# Patient Record
Sex: Male | Born: 2000
Health system: Southern US, Community
[De-identification: ages and names within clinical notes are randomized; demographics above are authoritative.]

---
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
## 2019-04-08 DIAGNOSIS — S50812A Abrasion of left forearm, initial encounter: Secondary | ICD-10-CM | POA: Diagnosis not present

## 2019-04-08 DIAGNOSIS — S92422A Displaced fracture of distal phalanx of left great toe, initial encounter for closed fracture: Secondary | ICD-10-CM | POA: Diagnosis not present

## 2019-04-08 DIAGNOSIS — W19XXXA Unspecified fall, initial encounter: Secondary | ICD-10-CM | POA: Diagnosis not present

## 2019-04-08 DIAGNOSIS — S80212A Abrasion, left knee, initial encounter: Secondary | ICD-10-CM | POA: Diagnosis not present

## 2019-07-17 DIAGNOSIS — Z03818 Encounter for observation for suspected exposure to other biological agents ruled out: Secondary | ICD-10-CM | POA: Diagnosis not present

## 2019-11-29 IMAGING — DX DG WRIST COMPLETE 3+V*R*
4 series · 4 of 4 positions shown · non-contrast
Comparison: None.

CLINICAL DATA: Pain

EXAM:
RIGHT WRIST - COMPLETE 3+ VIEW

[x wrist pa right]
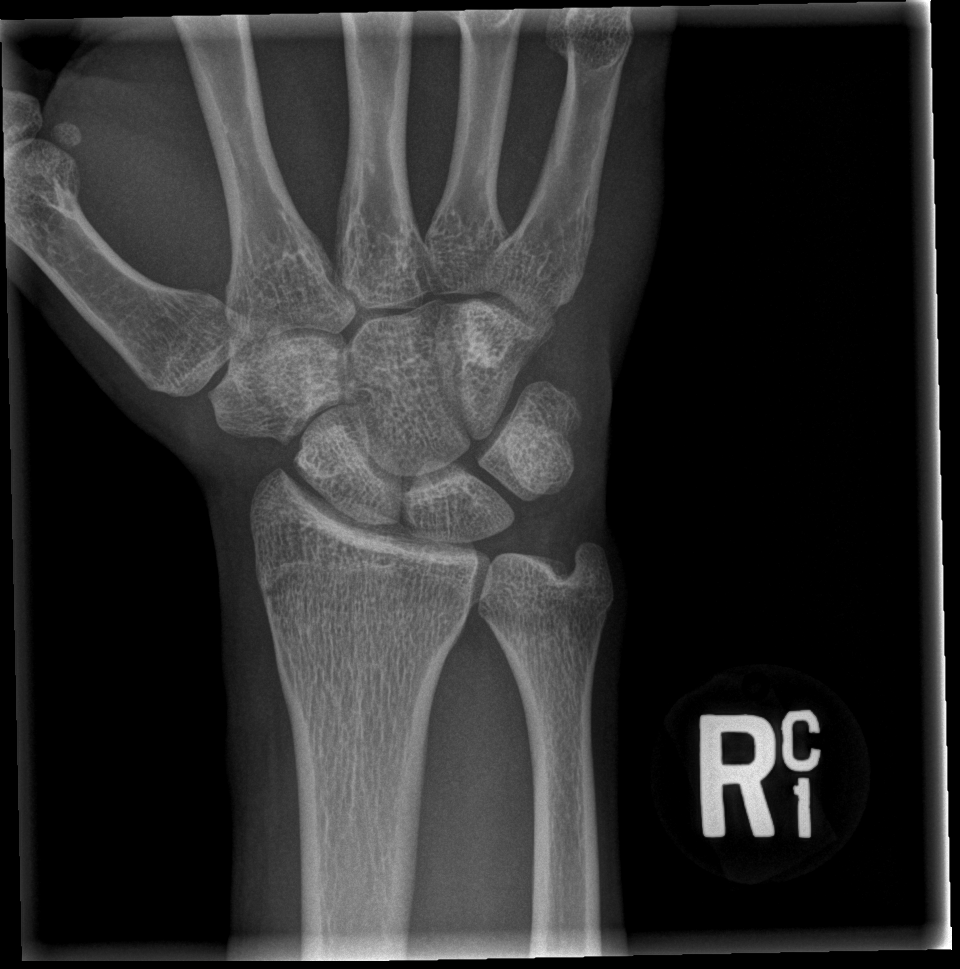

[x wrist obl right]
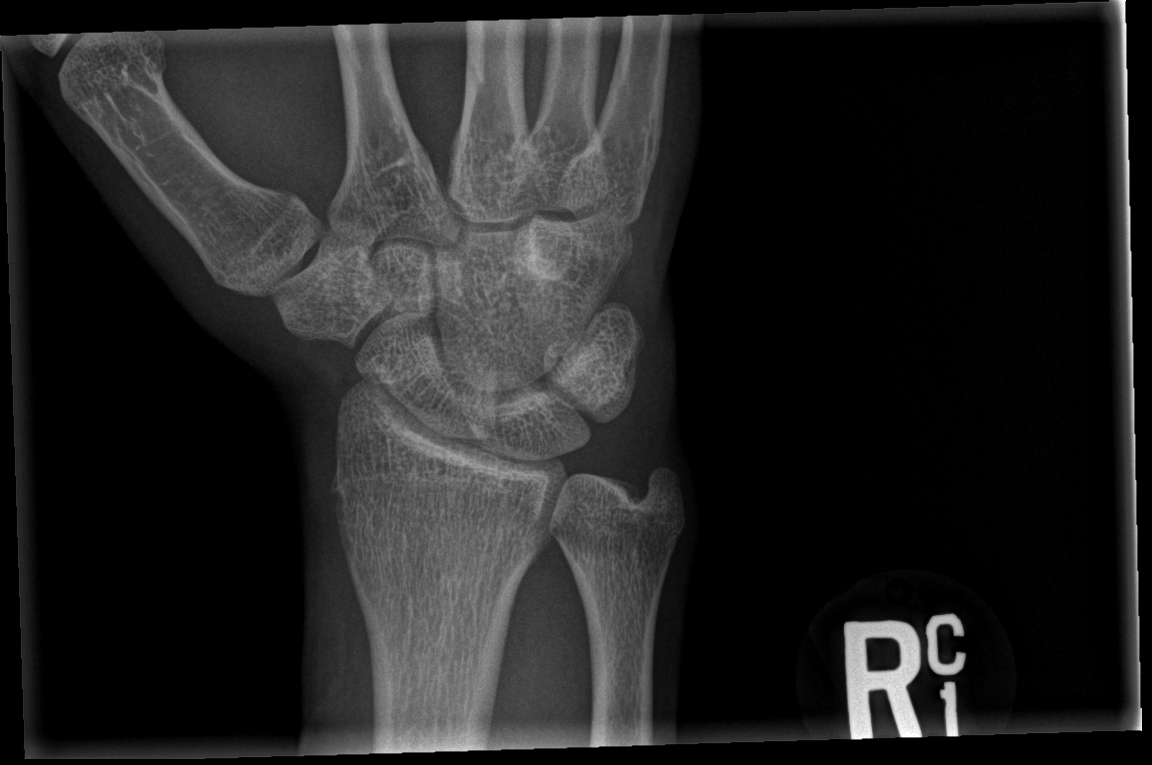

[x wrist lat right]
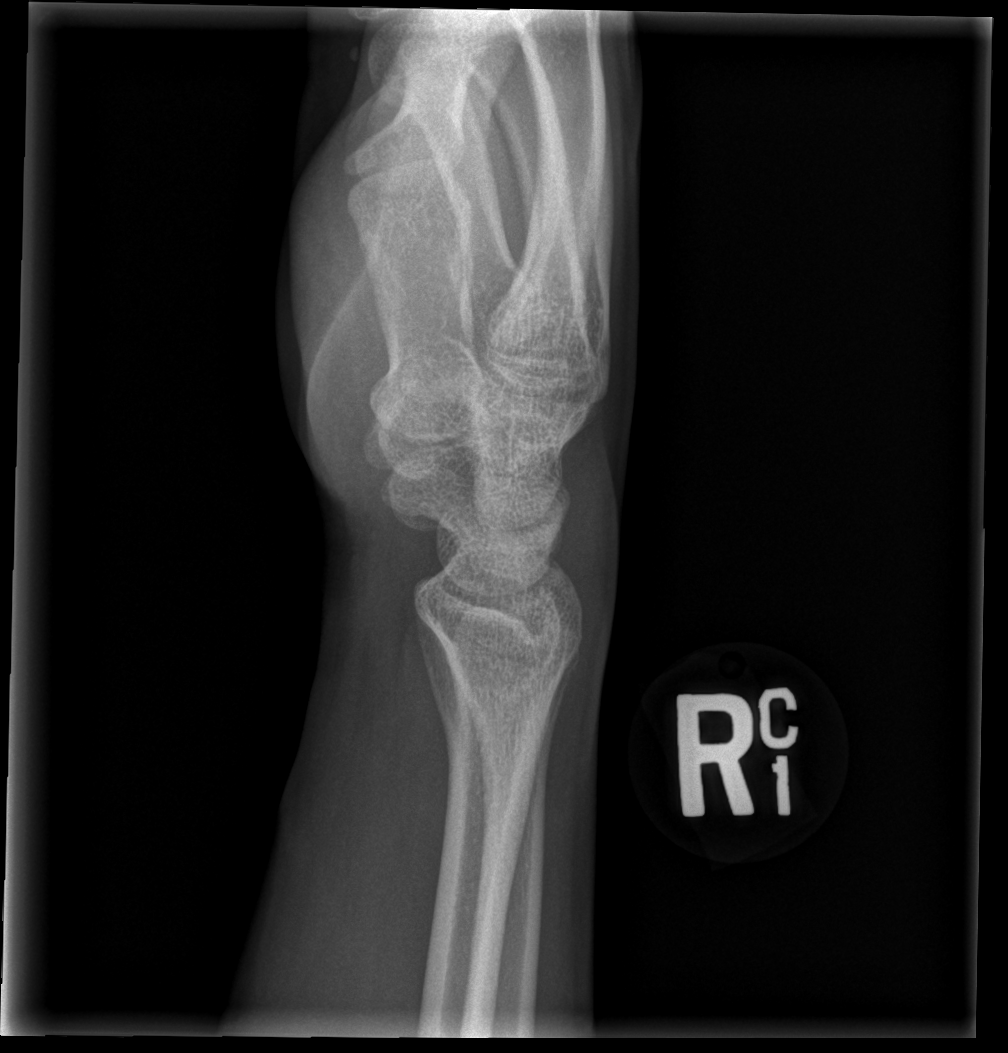

[x wrist navicular view right]
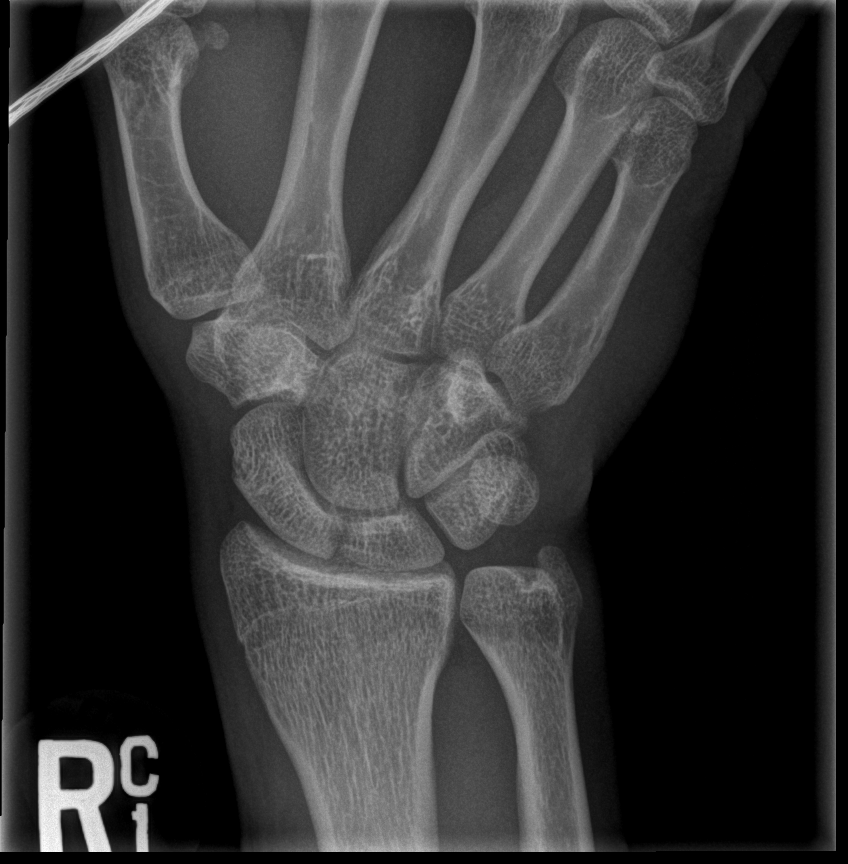

[4 of 4 positions shown; findings below may reference images not displayed]

FINDINGS: There is no evidence of fracture or dislocation. There is no
evidence of arthropathy or other focal bone abnormality. Soft
tissues are unremarkable.
IMPRESSION: Negative.
